# Patient Record
Sex: Male | Born: 1980 | Race: White | Hispanic: No | Marital: Married | Smoking: Former smoker
Health system: Southern US, Community
[De-identification: ages and names within clinical notes are randomized; demographics above are authoritative.]

## PROBLEM LIST (undated history)

## (undated) DIAGNOSIS — E7800 Pure hypercholesterolemia, unspecified: Secondary | ICD-10-CM

## (undated) DIAGNOSIS — F319 Bipolar disorder, unspecified: Secondary | ICD-10-CM

## (undated) DIAGNOSIS — F909 Attention-deficit hyperactivity disorder, unspecified type: Secondary | ICD-10-CM

---

## 2010-04-22 ENCOUNTER — Ambulatory Visit (HOSPITAL_COMMUNITY): Payer: Self-pay | Admitting: Psychiatry

## 2010-05-06 ENCOUNTER — Ambulatory Visit (HOSPITAL_COMMUNITY): Payer: Self-pay | Admitting: Psychology

## 2010-06-01 ENCOUNTER — Ambulatory Visit (HOSPITAL_COMMUNITY): Payer: Self-pay | Admitting: Psychology

## 2010-06-17 ENCOUNTER — Ambulatory Visit (HOSPITAL_COMMUNITY): Payer: Self-pay | Admitting: Psychiatry

## 2010-06-22 ENCOUNTER — Ambulatory Visit (HOSPITAL_COMMUNITY): Payer: Self-pay | Admitting: Psychology

## 2010-08-12 ENCOUNTER — Ambulatory Visit (HOSPITAL_COMMUNITY): Admit: 2010-08-12 | Payer: Self-pay | Admitting: Psychiatry

## 2010-08-12 ENCOUNTER — Encounter (INDEPENDENT_AMBULATORY_CARE_PROVIDER_SITE_OTHER): Payer: Self-pay | Admitting: Psychiatry

## 2010-08-12 DIAGNOSIS — F3289 Other specified depressive episodes: Secondary | ICD-10-CM

## 2010-08-26 ENCOUNTER — Encounter (HOSPITAL_COMMUNITY): Payer: Self-pay | Admitting: Psychology

## 2010-08-26 ENCOUNTER — Encounter (INDEPENDENT_AMBULATORY_CARE_PROVIDER_SITE_OTHER): Payer: BC Managed Care – PPO | Admitting: Psychiatry

## 2010-08-26 DIAGNOSIS — F3289 Other specified depressive episodes: Secondary | ICD-10-CM

## 2010-09-02 ENCOUNTER — Encounter (HOSPITAL_COMMUNITY): Payer: Self-pay | Admitting: Psychology

## 2010-10-07 ENCOUNTER — Encounter (INDEPENDENT_AMBULATORY_CARE_PROVIDER_SITE_OTHER): Payer: BC Managed Care – PPO | Admitting: Psychiatry

## 2010-10-07 DIAGNOSIS — F3289 Other specified depressive episodes: Secondary | ICD-10-CM

## 2010-12-02 ENCOUNTER — Encounter (INDEPENDENT_AMBULATORY_CARE_PROVIDER_SITE_OTHER): Payer: BC Managed Care – PPO | Admitting: Psychiatry

## 2010-12-02 DIAGNOSIS — F3289 Other specified depressive episodes: Secondary | ICD-10-CM

## 2011-02-24 ENCOUNTER — Encounter (INDEPENDENT_AMBULATORY_CARE_PROVIDER_SITE_OTHER): Payer: BC Managed Care – PPO | Admitting: Psychiatry

## 2011-02-24 DIAGNOSIS — F909 Attention-deficit hyperactivity disorder, unspecified type: Secondary | ICD-10-CM

## 2011-02-24 DIAGNOSIS — F3289 Other specified depressive episodes: Secondary | ICD-10-CM

## 2011-06-12 ENCOUNTER — Other Ambulatory Visit (HOSPITAL_COMMUNITY): Payer: Self-pay | Admitting: Psychiatry

## 2011-06-16 ENCOUNTER — Encounter (HOSPITAL_COMMUNITY): Payer: BC Managed Care – PPO | Admitting: Psychiatry

## 2011-06-16 ENCOUNTER — Ambulatory Visit (INDEPENDENT_AMBULATORY_CARE_PROVIDER_SITE_OTHER): Payer: BC Managed Care – PPO | Admitting: Psychiatry

## 2011-06-16 ENCOUNTER — Encounter (HOSPITAL_COMMUNITY): Payer: Self-pay | Admitting: Psychiatry

## 2011-06-16 DIAGNOSIS — F909 Attention-deficit hyperactivity disorder, unspecified type: Secondary | ICD-10-CM

## 2011-06-16 MED ORDER — LISDEXAMFETAMINE DIMESYLATE 60 MG PO CAPS: 60.0000 mg | ORAL_CAPSULE | ORAL | Status: DC

## 2011-06-16 NOTE — Progress Notes (Signed)
Patient ID: James Cuevas, male   DOB: [birth year], 30 y.o.   MRN: [id number] Has had recent bout of pneumonia treated with antibiotics.  Short bout of depression that is resolving.  Still not sleeping well since illness.  Mood usually stable.  OK with meds.

## 2011-09-08 ENCOUNTER — Ambulatory Visit (HOSPITAL_COMMUNITY): Payer: BC Managed Care – PPO | Admitting: Psychiatry

## 2017-11-26 ENCOUNTER — Encounter (HOSPITAL_COMMUNITY): Payer: Self-pay | Admitting: *Deleted

## 2017-11-26 ENCOUNTER — Other Ambulatory Visit: Payer: Self-pay

## 2017-11-26 ENCOUNTER — Emergency Department (HOSPITAL_COMMUNITY): Payer: 59

## 2017-11-26 ENCOUNTER — Emergency Department (HOSPITAL_COMMUNITY)
Admission: EM | Admit: 2017-11-26 | Discharge: 2017-11-27 | Disposition: A | Discharge status: Home / Self Care | Payer: 59 | Attending: Emergency Medicine | Admitting: Emergency Medicine

## 2017-11-26 DIAGNOSIS — R079 Chest pain, unspecified: Secondary | ICD-10-CM | POA: Diagnosis present

## 2017-11-26 DIAGNOSIS — R0789 Other chest pain: Secondary | ICD-10-CM | POA: Diagnosis not present

## 2017-11-26 DIAGNOSIS — Z87891 Personal history of nicotine dependence: Secondary | ICD-10-CM | POA: Insufficient documentation

## 2017-11-26 HISTORY — DX: Bipolar disorder, unspecified: F31.9

## 2017-11-26 HISTORY — DX: Pure hypercholesterolemia, unspecified: E78.00

## 2017-11-26 HISTORY — DX: Attention-deficit hyperactivity disorder, unspecified type: F90.9

## 2017-11-26 LAB — BASIC METABOLIC PANEL
Anion gap: 8 (ref 5–15)
BUN: 24 mg/dL — AB (ref 6–20)
CO2: 25 mmol/L (ref 22–32)
Calcium: 9.1 mg/dL (ref 8.9–10.3)
Chloride: 104 mmol/L (ref 101–111)
Creatinine, Ser: 1.13 mg/dL (ref 0.61–1.24)
GFR calc Af Amer: >60 mL/min (ref >60)
GLUCOSE: 98 mg/dL (ref 65–99)
POTASSIUM: 3.6 mmol/L (ref 3.5–5.1)
Sodium: 137 mmol/L (ref 135–145)

## 2017-11-26 LAB — TROPONIN I: Troponin I: <0.03 ng/mL (ref <0.03)

## 2017-11-26 MED ORDER — ONDANSETRON 4 MG PO TBDP
4.0000 mg | ORAL_TABLET | Freq: Once | ORAL | Status: AC
  Administered 2017-11-26: 4 mg via ORAL

## 2017-11-26 MED ORDER — ONDANSETRON HCL 4 MG/2ML IJ SOLN
INTRAMUSCULAR | Status: AC
  Filled 2017-11-26: qty 2

## 2017-11-26 MED ORDER — ONDANSETRON 4 MG PO TBDP
ORAL_TABLET | ORAL | Status: AC
  Filled 2017-11-26: qty 1

## 2017-11-26 NOTE — ED Triage Notes (Signed)
Pt reports chest pain and sob x since earlier today. Pt denies any other symptoms.

## 2017-11-26 NOTE — ED Notes (Signed)
Pt's visitor came out stating that pt was having a seizure. Upon entering room, pt was noted to be pale in color but awake and able to answer all questions. Lab was at bedside drawing blood at the time this happened. Pt's HR on monitor was 33-44 during episode. Dr Wilkie Aye to room and determined that pt had a vagal response to having his blood drawn. Pt nauseated and given medication for this. HR WNL. Will continue to monitor.

## 2017-11-26 NOTE — ED Provider Notes (Signed)
[facility] EMERGENCY DEPARTMENT Provider Note   CSN: [id number] Arrival date & time: 11/26/17  2118     History   Chief Complaint Chief Complaint  Patient presents with  . Chest Pain    HPI James Cuevas is a 37 y.o. male.  HPI  This is a 37 year old male with a history of hypercholesterolemia who presents with chest pain.  Patient reports intermittent episodes of "tapping" like chest pain.  He states that this comes and goes randomly.  It lasts for seconds at a time and self resolves.  Has not noted any relation with exertion or food.  Denies any fevers, cough, shortness of breath.  Never had pain like this before.  Denies lower extremity swelling or history of blood clots.  He does report increased stress at work.  Of note, I was called to the room as patient had an episode of loss of consciousness.  He was bradycardic during this time.  On my evaluation, patient is nauseous.  He states that this all occurred when getting his blood drawn.  This is happened to him in the past where he has passed out getting his blood drawn.  Denies any abdominal pain.  Past Medical History:  Diagnosis Date  . ADHD   . Bipolar 1 disorder (HCC)   . Hypercholesteremia     There are no active problems to display for this patient.   History reviewed. No pertinent surgical history.      Home Medications    Prior to Admission medications   Medication Sig Start Date End Date Taking? Authorizing Provider  DULoxetine (CYMBALTA) 30 MG capsule Take 30-60 mg by mouth See admin instructions.  in the morning and  at bedtime   Yes [provider]  QUEtiapine (SEROQUEL) 200 MG tablet Take 200 mg by mouth daily.   Yes [provider]    Family History No family history on file.  Social History Social History   Tobacco Use  . Smoking status: Former Games developer  . Smokeless tobacco: Former Engineer, water Use Topics  . Alcohol use: Yes    Comment: occassionally   .  Drug use: Never     Allergies   Depakote [divalproex sodium] and Oxycodone   Review of Systems Review of Systems  Constitutional: Positive for fever.  Respiratory: Negative for cough and shortness of breath.   Cardiovascular: Positive for chest pain. Negative for leg swelling.  Gastrointestinal: Positive for nausea. Negative for abdominal pain and vomiting.  Genitourinary: Negative for dysuria.  Neurological: Positive for syncope.  All other systems reviewed and are negative.    Physical Exam Updated Vital Signs BP 106/76   Pulse 62   Temp 98.2 F (36.8 C) (Oral)   Resp 20   Ht  (1.803 m)   Wt 86.6 kg (191 lb)   SpO2 100%   BMI 26.64 kg/m   Physical Exam  Constitutional: He is oriented to person, place, and time. He appears well-developed and well-nourished. No distress.  Pale-appearing but nontoxic  HENT:  Head: Normocephalic and atraumatic.  Eyes: Pupils are equal, round, and reactive to light.  Neck: Neck supple.  Cardiovascular: Normal rate, regular rhythm, normal heart sounds and normal pulses.  No murmur heard. Pulmonary/Chest: Effort normal and breath sounds normal. No respiratory distress. He has no wheezes.  Abdominal: Soft. Bowel sounds are normal. There is no tenderness. There is no rebound.  Musculoskeletal: He exhibits no edema.       Right lower leg:  He exhibits no tenderness and no edema.       Left lower leg: He exhibits no tenderness and no edema.  Lymphadenopathy:    He has no cervical adenopathy.  Neurological: He is alert and oriented to person, place, and time. No cranial nerve deficit.  Skin: Skin is warm and dry.  Psychiatric: He has a normal mood and affect.  Nursing note and vitals reviewed.    ED Treatments / Results  Labs (all labs ordered are listed, but only abnormal results are displayed) Labs Reviewed  BASIC METABOLIC PANEL - Abnormal; Notable for the following components:      Result Value   BUN 24 (*)    All other  components within normal limits  CBC  TROPONIN I    EKG EKG Interpretation  Date/Time:  Monday Nov 26 2017 21:46:31 EDT Ventricular Rate:  86 PR Interval:  142 QRS Duration: 84 QT Interval:  352 QTC Calculation: 421 R Axis:   71 Text Interpretation:  Normal sinus rhythm Nonspecific T wave abnormality Abnormal ECG No prior for comparison Confirmed by Ross Marcus ([id number]) on 11/26/2017 10:59:39 PM   Radiology Dg Chest 2 View  Result Date: 11/26/2017 CLINICAL DATA:  Initial evaluation for acute chest pain, shortness of breath. EXAM: CHEST - 2 VIEW COMPARISON:  None. FINDINGS: The cardiac and mediastinal silhouettes are within normal limits. The lungs are normally inflated. No airspace consolidation, pleural effusion, or pulmonary edema is identified. There is no pneumothorax. No acute osseous abnormality identified. IMPRESSION: No active cardiopulmonary disease. Electronically Signed   By: Rise Mu M.D.   On: 11/26/2017 22:35    Procedures Procedures (including critical care time)  Medications Ordered in ED Medications  ondansetron (ZOFRAN-ODT) disintegrating tablet 4 mg (4 mg Oral Given 11/26/17 2313)     Initial Impression / Assessment and Plan / ED Course  I have reviewed the triage vital signs and the nursing notes.  Pertinent labs & imaging results that were available during my care of the patient were reviewed by me and considered in my medical decision making (see chart for details).     Presents with chest pain.  Fairly atypical and not exertional and nature.  He is overall nontoxic-appearing and vital signs are reassuring.  He did appear to have a vagal episode with blood draw.  He did not actually pass out but he got bradycardic and nauseous.  This is happened to him in the past.  He recovered spontaneously.  No evidence of arrhythmia or ischemia on EKG.  Troponin is negative.  Doubt ACS.  He is low risk for blood clots and PERC negative.  Chest x-ray  shows no evidence of pneumothorax or pneumonia.  He has had no recurrence of symptoms while in the ED.  Discussed with patient that he should follow-up with cardiology as an outpatient.  Feel he is safe for discharge home and outpatient follow-up.  After history, exam, and medical workup I feel the patient has been appropriately medically screened and is safe for discharge home. Pertinent diagnoses were discussed with the patient. Patient was given return precautions.   Final Clinical Impressions(s) / ED Diagnoses   Final diagnoses:  Atypical chest pain    ED Discharge Orders    None       Selestino Nila, Mayer Masker, MD 11/27/17 [phone]

## 2017-11-27 LAB — CBC
HEMATOCRIT: 41.6 % (ref 39.0–52.0)
Hemoglobin: 14.4 g/dL (ref 13.0–17.0)
MCH: 30.6 pg (ref 26.0–34.0)
MCHC: 34.6 g/dL (ref 30.0–36.0)
MCV: 88.5 fL (ref 78.0–100.0)
Platelets: 279 10*3/uL (ref 150–400)
RBC: 4.7 MIL/uL (ref 4.22–5.81)
RDW: 12.2 % (ref 11.5–15.5)
WBC: 9.5 10*3/uL (ref 4.0–10.5)

## 2017-11-27 NOTE — Discharge Instructions (Addendum)
You were seen today for chest pain.  Your work-up is reassuring.  Follow-up with cardiology if symptoms persist.

## 2018-10-16 ENCOUNTER — Encounter (HOSPITAL_COMMUNITY): Payer: Self-pay | Admitting: Emergency Medicine

## 2018-10-16 ENCOUNTER — Other Ambulatory Visit: Payer: Self-pay

## 2018-10-16 ENCOUNTER — Emergency Department (HOSPITAL_COMMUNITY): Payer: BLUE CROSS/BLUE SHIELD

## 2018-10-16 ENCOUNTER — Emergency Department (HOSPITAL_COMMUNITY)
Admission: EM | Admit: 2018-10-16 | Discharge: 2018-10-16 | Disposition: A | Discharge status: Home / Self Care | Payer: BLUE CROSS/BLUE SHIELD | Attending: Emergency Medicine | Admitting: Emergency Medicine

## 2018-10-16 DIAGNOSIS — M545 Low back pain, unspecified: Secondary | ICD-10-CM

## 2018-10-16 DIAGNOSIS — Z87891 Personal history of nicotine dependence: Secondary | ICD-10-CM | POA: Diagnosis not present

## 2018-10-16 DIAGNOSIS — R1084 Generalized abdominal pain: Secondary | ICD-10-CM | POA: Insufficient documentation

## 2018-10-16 DIAGNOSIS — Z79899 Other long term (current) drug therapy: Secondary | ICD-10-CM | POA: Diagnosis not present

## 2018-10-16 DIAGNOSIS — R109 Unspecified abdominal pain: Secondary | ICD-10-CM

## 2018-10-16 LAB — URINALYSIS, ROUTINE W REFLEX MICROSCOPIC
Bilirubin Urine: NEGATIVE
Glucose, UA: NEGATIVE mg/dL
Hgb urine dipstick: NEGATIVE
Ketones, ur: NEGATIVE mg/dL
Leukocytes,Ua: NEGATIVE
Nitrite: NEGATIVE
Protein, ur: NEGATIVE mg/dL
Specific Gravity, Urine: 1.032 — ABNORMAL HIGH (ref 1.005–1.030)
pH: 5 (ref 5.0–8.0)

## 2018-10-16 MED ORDER — KETOROLAC TROMETHAMINE 30 MG/ML IJ SOLN
30.0000 mg | Freq: Once | INTRAMUSCULAR | Status: AC
  Administered 2018-10-16: 18:00:00 30 mg via INTRAMUSCULAR
  Filled 2018-10-16: qty 1

## 2018-10-16 MED ORDER — METHOCARBAMOL 750 MG PO TABS: 750.0000 mg | ORAL_TABLET | Freq: Three times a day (TID) | ORAL | 0 refills | Status: DC | PRN

## 2018-10-16 NOTE — ED Triage Notes (Signed)
Patient has bilateral flank pain that started around 1 pm.

## 2018-10-16 NOTE — ED Notes (Signed)
Pt returned from ct. nad 

## 2018-10-16 NOTE — ED Notes (Signed)
Pt taken to ct. nAd

## 2018-10-16 NOTE — Discharge Instructions (Addendum)
It was our pleasure to provide your ER care today - we hope that you feel better.  Take motrin or aleve as need for pain (available over the counter). You may also take robaxin as need for muscle pain/spasm - no driving when taking.  Follow up with primary care doctor in the next few weeks if symptoms fail to improve/resolve.  Return to ER if worse, intractable pain, numbness/weakness, other concern.

## 2018-10-16 NOTE — ED Provider Notes (Signed)
[facility] EMERGENCY DEPARTMENT Provider Note   CSN: [id number] Arrival date & time: 10/16/18  1729    History   Chief Complaint Chief Complaint  Patient presents with   Flank Pain    HPI James Cuevas is a 38 y.o. male.     Patient c/o acute onset left flank pain posteriorly this afternoon. Pain dull, mod-severe, waxes and wanes in intensity, non radiating, persistent. Denies hx same pain. No hx kidney stones. No hx chronic/recurrent back pain. No hx ddd. Denies back injury or strain. No hematuria or dysuria. No numbness or weakness in saddle area or in legs. No urinary retention or bowel incontinence. No fever or chills.   The history is provided by the patient.  Flank Pain  Pertinent negatives include no chest pain, no abdominal pain and no shortness of breath.    Past Medical History:  Diagnosis Date   ADHD    Bipolar 1 disorder (HCC)    Hypercholesteremia     There are no active problems to display for this patient.   History reviewed. No pertinent surgical history.      Home Medications    Prior to Admission medications   Medication Sig Start Date End Date Taking? Authorizing Provider  DULoxetine (CYMBALTA) 30 MG capsule Take 30-60 mg by mouth See admin instructions.  in the morning and  at bedtime    [provider]  QUEtiapine (SEROQUEL) 200 MG tablet Take 200 mg by mouth daily.    [provider]    Family History History reviewed. No pertinent family history.  Social History Social History   Tobacco Use   Smoking status: Former Smoker   Smokeless tobacco: Former Engineer, water Use Topics   Alcohol use: Yes    Comment: occassionally    Drug use: Never     Allergies   Depakote [divalproex sodium] and Oxycodone   Review of Systems Review of Systems  Constitutional: Negative for fever.  HENT: Negative for sore throat.   Eyes: Negative for redness.  Respiratory: Negative for cough and shortness of  breath.   Cardiovascular: Negative for chest pain.  Gastrointestinal: Negative for abdominal pain and vomiting.  Genitourinary: Positive for flank pain. Negative for dysuria and hematuria.  Musculoskeletal: Positive for back pain.  Skin: Negative for rash.  Neurological: Negative for weakness and numbness.  Hematological: Does not bruise/bleed easily.  Psychiatric/Behavioral: Negative for confusion.     Physical Exam Updated Vital Signs BP 106/81 (BP Location: Left Arm)    Pulse (!) 107    Temp 98.5 F (36.9 C) (Oral)    Resp 20    Ht 1.803 m ( )    Wt 94.3 kg    SpO2 96%    BMI 29.01 kg/m   Physical Exam Vitals signs and nursing note reviewed.  Constitutional:      Appearance: Normal appearance. He is well-developed.  HENT:     Head: Atraumatic.     Nose: Nose normal.     Mouth/Throat:     Mouth: Mucous membranes are moist.     Pharynx: Oropharynx is clear.  Eyes:     General: No scleral icterus.    Conjunctiva/sclera: Conjunctivae normal.  Neck:     Musculoskeletal: Normal range of motion and neck supple. No neck rigidity.     Trachea: No tracheal deviation.  Cardiovascular:     Rate and Rhythm: Normal rate.     Pulses: Normal pulses.  Pulmonary:     Effort: Pulmonary  effort is normal. No accessory muscle usage or respiratory distress.  Abdominal:     General: Bowel sounds are normal. There is no distension.     Palpations: Abdomen is soft. There is no mass.     Tenderness: There is no abdominal tenderness. There is no guarding.  Genitourinary:    Comments: No cva tenderness. No scrotal or testicular pain or tenderness.  Musculoskeletal:        General: No swelling.     Comments: TLS spine non tender, aligned.   Skin:    General: Skin is warm and dry.     Findings: No rash.  Neurological:     Mental Status: He is alert.     Comments: Alert, speech clear. Left straight leg raise neg. Motor intact bil, stre 5/5. sens grossly intact. Steady gait.     Psychiatric:        Mood and Affect: Mood normal.      ED Treatments / Results  Labs (all labs ordered are listed, but only abnormal results are displayed) Results for orders placed or performed during the hospital encounter of 10/16/18  Urinalysis, Routine w reflex microscopic  Result Value Ref Range   Color, Urine YELLOW YELLOW   APPearance HAZY (A) CLEAR   Specific Gravity, Urine 1.032 (H) 1.005 - 1.030   pH 5.0 5.0 - 8.0   Glucose, UA NEGATIVE NEGATIVE mg/dL   Hgb urine dipstick NEGATIVE NEGATIVE   Bilirubin Urine NEGATIVE NEGATIVE   Ketones, ur NEGATIVE NEGATIVE mg/dL   Protein, ur NEGATIVE NEGATIVE mg/dL   Nitrite NEGATIVE NEGATIVE   Leukocytes,Ua NEGATIVE NEGATIVE   Ct Renal Stone Study  Result Date: 10/16/2018 CLINICAL DATA:  Bilateral flank pain EXAM: CT ABDOMEN AND PELVIS WITHOUT CONTRAST TECHNIQUE: Multidetector CT imaging of the abdomen and pelvis was performed following the standard protocol without oral or IV contrast. COMPARISON:  None. FINDINGS: Lower chest: Lung bases are clear. Hepatobiliary: No focal liver lesions are apparent on this noncontrast enhanced study. Gallbladder is contracted without appreciable wall thickening. There is no appreciable biliary duct dilatation. Pancreas: There is no pancreatic mass or inflammatory focus. Spleen: No splenic lesions are evident. Adrenals/Urinary Tract: Adrenals bilaterally appear unremarkable. There is a 9 mm cyst arising from the medial lower pole of the right kidney. There is no hydronephrosis on either side. There is no evident renal or ureteral calculus on either side. Urinary bladder is essentially empty. Urinary bladder wall does not appear appreciably thickened given essentially empty state. Stomach/Bowel: There is no appreciable bowel wall or mesenteric thickening. Most small bowel loops are fluid-filled. There is no evident bowel obstruction. There is no free air or portal venous air. Vascular/Lymphatic: There is aortic  and common iliac artery atherosclerosis. No evident aneurysm. No adenopathy is evident in the abdomen or pelvis. Reproductive: Prostate and seminal vesicles are normal in size and contour. There is no evident pelvic mass. Other: Appendix appears normal. There is no abscess or ascites in the abdomen or pelvis. Musculoskeletal: There are no blastic or lytic bone lesions. There is no intramuscular or abdominal wall lesion evident. IMPRESSION: 1. Most small bowel loops are fluid-filled. While this finding may be seen normally, it may be indicative of enteritis or early ileus. No evident bowel obstruction. 2.  No abscess in the abdomen or pelvis.  Appendix appears normal. 3. No evident renal or ureteral calculus. No hydronephrosis. Urinary bladder is centrally empty. 4.  Aortic Atherosclerosis (ICD10-I70.0). Electronically Signed   By: Bretta Bang III  M.D.   On: 10/16/2018 19:02    EKG None  Radiology Ct Renal Stone Study  Result Date: 10/16/2018 CLINICAL DATA:  Bilateral flank pain EXAM: CT ABDOMEN AND PELVIS WITHOUT CONTRAST TECHNIQUE: Multidetector CT imaging of the abdomen and pelvis was performed following the standard protocol without oral or IV contrast. COMPARISON:  None. FINDINGS: Lower chest: Lung bases are clear. Hepatobiliary: No focal liver lesions are apparent on this noncontrast enhanced study. Gallbladder is contracted without appreciable wall thickening. There is no appreciable biliary duct dilatation. Pancreas: There is no pancreatic mass or inflammatory focus. Spleen: No splenic lesions are evident. Adrenals/Urinary Tract: Adrenals bilaterally appear unremarkable. There is a 9 mm cyst arising from the medial lower pole of the right kidney. There is no hydronephrosis on either side. There is no evident renal or ureteral calculus on either side. Urinary bladder is essentially empty. Urinary bladder wall does not appear appreciably thickened given essentially empty state. Stomach/Bowel:  There is no appreciable bowel wall or mesenteric thickening. Most small bowel loops are fluid-filled. There is no evident bowel obstruction. There is no free air or portal venous air. Vascular/Lymphatic: There is aortic and common iliac artery atherosclerosis. No evident aneurysm. No adenopathy is evident in the abdomen or pelvis. Reproductive: Prostate and seminal vesicles are normal in size and contour. There is no evident pelvic mass. Other: Appendix appears normal. There is no abscess or ascites in the abdomen or pelvis. Musculoskeletal: There are no blastic or lytic bone lesions. There is no intramuscular or abdominal wall lesion evident. IMPRESSION: 1. Most small bowel loops are fluid-filled. While this finding may be seen normally, it may be indicative of enteritis or early ileus. No evident bowel obstruction. 2.  No abscess in the abdomen or pelvis.  Appendix appears normal. 3. No evident renal or ureteral calculus. No hydronephrosis. Urinary bladder is centrally empty. 4.  Aortic Atherosclerosis (ICD10-I70.0). Electronically Signed   By: Bretta BangWilliam  Woodruff III M.D.   On: 10/16/2018 19:02    Procedures Procedures (including critical care time)  Medications Ordered in ED Medications  ketorolac (TORADOL) 30 MG/ML injection 30 mg (has no administration in time range)     Initial Impression / Assessment and Plan / ED Course  I have reviewed the triage vital signs and the nursing notes.  Pertinent labs & imaging results that were available during my care of the patient were reviewed by me and considered in my medical decision making (see chart for details).  Labs and imaging ordered.   Reviewed nursing notes and prior charts for additional history.   Toradol 30 mg im.   CT reviewed by me - no ureteral stones, no hydro.  Labs reviewed by me - no hematuria.   Based on ED workup, feel MSK pain most likely, msk strain vs ddd.   Patient appears comfortable, nad and stable for d/c.   rec  pcp f/u.   Return precautions provided.     Final Clinical Impressions(s) / ED Diagnoses   Final diagnoses:  None    ED Discharge Orders    None       Cathren LaineSteinl, Armstrong Creasy, MD 10/16/18 1907

## 2018-10-16 NOTE — ED Notes (Signed)
Patient transported to CT 

## 2019-03-06 ENCOUNTER — Ambulatory Visit
Admission: EM | Admit: 2019-03-06 | Discharge: 2019-03-06 | Disposition: A | Discharge status: Home / Self Care | Payer: Managed Care, Other (non HMO) | Attending: Emergency Medicine | Admitting: Emergency Medicine

## 2019-03-06 ENCOUNTER — Other Ambulatory Visit: Payer: Self-pay

## 2019-03-06 DIAGNOSIS — G43009 Migraine without aura, not intractable, without status migrainosus: Secondary | ICD-10-CM

## 2019-03-06 DIAGNOSIS — R110 Nausea: Secondary | ICD-10-CM

## 2019-03-06 MED ORDER — ONDANSETRON 4 MG PO TBDP
4.0000 mg | ORAL_TABLET | Freq: Once | ORAL | Status: AC
  Administered 2019-03-06: 4 mg via ORAL

## 2019-03-06 MED ORDER — IBUPROFEN 800 MG PO TABS: 800.0000 mg | ORAL_TABLET | Freq: Three times a day (TID) | ORAL | 0 refills | Status: DC

## 2019-03-06 MED ORDER — IBUPROFEN 800 MG PO TABS
800.0000 mg | ORAL_TABLET | Freq: Once | ORAL | Status: AC
  Administered 2019-03-06: 800 mg via ORAL

## 2019-03-06 MED ORDER — ONDANSETRON HCL 4 MG PO TABS: 4.0000 mg | ORAL_TABLET | Freq: Four times a day (QID) | ORAL | 0 refills | Status: AC

## 2019-03-06 NOTE — ED Triage Notes (Signed)
Pt states she has had anal itching for past month, she has used preperation 8 and caused redness and burning

## 2019-03-06 NOTE — ED Provider Notes (Signed)
[facility]   [id number] 03/06/19 Arrival Time: 1346  WU:[id number]:HEADACHE  SUBJECTIVE:  James Cuevas is a 38 y.o. male hx significant for ADHD, bipolar 1 disorder, and hypercholesteremia, who complains of migraine x 2 days.  Denies a precipitating event, or recent head trauma.  Patient localizes his pain to the frontal aspect.  Describes the pain as intermittent and pounding in character.  Patient has NOT tried OTC medications. Symptoms are made worse with light.  Reports similar migraine in the past.  This is not the worst headache of his life. Complains of associated nausea and photophobia. Patient denies fever, chills, vision changes, vomiting, chest pain, SOB, abdominal pain, weakness, numbness or tingling, slurred speech.     ROS: As per HPI.  All other pertinent ROS negative.     Past Medical History:  Diagnosis Date  . ADHD   . Bipolar 1 disorder (HCC)   . Hypercholesteremia    History reviewed. No pertinent surgical history. Allergies  Allergen Reactions  . Depakote [Divalproex Sodium] Nausea Only    headache  . Oxycodone     Hallucinations-Feels like bugs are crawling   No current facility-administered medications on file prior to encounter.    Current Outpatient Medications on File Prior to Encounter  Medication Sig Dispense Refill  . Acetaminophen (PAIN RELIEVER PO) Take 1-2 tablets by mouth once as needed (for muscle relaxation/pain).    . QUEtiapine (SEROQUEL) 300 MG tablet Take 600 mg by mouth at bedtime.    Marland Kitchen. venlafaxine (EFFEXOR) 75 MG tablet Take 75 mg by mouth 3 (three) times daily.     Social History   Socioeconomic History  . Marital status: Married    Spouse name: Not on file  . Number of children: Not on file  . Years of education: Not on file  . Highest education level: Not on file  Occupational History  . Not on file  Social Needs  . Financial resource strain: Not on file  . Food insecurity    Worry: Not on file    Inability: Not on  file  . Transportation needs    Medical: Not on file    Non-medical: Not on file  Tobacco Use  . Smoking status: Former Games developermoker  . Smokeless tobacco: Former Engineer, waterUser  Substance and Sexual Activity  . Alcohol use: Yes    Comment: occassionally   . Drug use: Never  . Sexual activity: Not on file  Lifestyle  . Physical activity    Days per week: Not on file    Minutes per session: Not on file  . Stress: Not on file  Relationships  . Social Musicianconnections    Talks on phone: Not on file    Gets together: Not on file    Attends religious service: Not on file    Active member of club or organization: Not on file    Attends meetings of clubs or organizations: Not on file    Relationship status: Not on file  . Intimate partner violence    Fear of current or ex partner: Not on file    Emotionally abused: Not on file    Physically abused: Not on file    Forced sexual activity: Not on file  Other Topics Concern  . Not on file  Social History Narrative  . Not on file   History reviewed. No pertinent family history.  OBJECTIVE:  Vitals:   03/06/19 1356  BP: (!) 149/84  Pulse: (!) 112  Resp: 20  Temp: 98.3 F (36.8 C)  SpO2: 94%    General appearance: alert; appears uncomfortable, wearing sunglasses upon entering the room Eyes: PERRLA; EOMI grossly HENT: normocephalic; atraumatic; oropharynx clear Neck: supple with FROM Lungs: clear to auscultation bilaterally; normal respiratory effort Heart: regular rate and rhythm.  Radial pulses 2+ symmetrical bilaterally Extremities: no edema; symmetrical with no gross deformities Skin: warm and dry Neurologic: CN 2-12 grossly intact; finger to nose without difficulty; normal gait; strength and sensation intact bilaterally about the upper and lower extremities; negative pronator drift Psychological: alert and cooperative; normal mood and affect   ASSESSMENT & PLAN:  1. Migraine without aura and without status migrainosus, not intractable    2. Nausea without vomiting     Meds ordered this encounter  Medications  . ibuprofen (ADVIL) 800 MG tablet    Sig: Take 1 tablet (800 mg total) by mouth 3 (three) times daily.    Dispense:  21 tablet    Refill:  0    Order Specific Question:   Supervising Provider    Answer:   Raylene Everts [[id number]]  . ondansetron (ZOFRAN) 4 MG tablet    Sig: Take 1 tablet (4 mg total) by mouth every 6 (six) hours.    Dispense:  12 tablet    Refill:  0    Order Specific Question:   Supervising Provider    Answer:   Raylene Everts [[id number]]  . ibuprofen (ADVIL) tablet 800 mg  . ondansetron (ZOFRAN-ODT) disintegrating tablet 4 mg    Ibuprofen 800 mg and zofran given in office Rest and drink plenty of fluids Ibuprofen 800 mg prescribed.  Take as directed for migraine.  Avoid taking with other antiinflammatories as this may cause GI upset and/or possibly bleed Zofran prescribed.  Use as needed for nausea.   Follow up with [facility] to establish care Return or go to the ER if you have any new or worsening symptoms such as fever, chills, nausea, vomiting, chest pain, shortness of breath, cough, vision changes, worsening headache despite treatment, slurred speech, facial asymmetry, weakness in arms or legs, etc...  Reviewed expectations re: course of current medical issues. Questions answered. Outlined signs and symptoms indicating need for more acute intervention. Patient verbalized understanding. After Visit Summary given.   Lestine Box, PA-C 03/06/19 1421

## 2019-03-06 NOTE — Discharge Instructions (Addendum)
Ibuprofen 800 mg and zofran given in office Rest and drink plenty of fluids Ibuprofen 800 mg prescribed.  Take as directed for migraine.  Avoid taking with other antiinflammatories as this may cause GI upset and/or possibly bleed Zofran prescribed.  Use as needed for nausea.   Follow up with [facility] to establish care Return or go to the ER if you have any new or worsening symptoms such as fever, chills, nausea, vomiting, chest pain, shortness of breath, cough, vision changes, worsening headache despite treatment, slurred speech, facial asymmetry, weakness in arms or legs, etc..Marland Kitchen

## 2019-03-06 NOTE — ED Triage Notes (Signed)
Headache x 2 days with dizziness

## 2019-08-18 ENCOUNTER — Ambulatory Visit
Admission: EM | Admit: 2019-08-18 | Discharge: 2019-08-18 | Disposition: A | Discharge status: Home / Self Care | Payer: Managed Care, Other (non HMO) | Attending: Emergency Medicine | Admitting: Emergency Medicine

## 2019-08-18 ENCOUNTER — Other Ambulatory Visit: Payer: Self-pay

## 2019-08-18 DIAGNOSIS — Z20822 Contact with and (suspected) exposure to covid-19: Secondary | ICD-10-CM

## 2019-08-18 LAB — POC SARS CORONAVIRUS 2 AG -  ED: SARS Coronavirus 2 Ag: NEGATIVE

## 2019-08-18 NOTE — ED Provider Notes (Signed)
[facility]    CSN: [id number] Arrival date & time: 08/18/19  6301      History   Chief Complaint No chief complaint on file.   HPI James Cuevas is a 39 y.o. male.   who presents for COVID testing after Covid exposure for the past 7 days.  Denies sick exposure to  flu or strep.  Denies recent travel.  Denies aggravating or alleviating symptoms.  Denies previous COVID infection.   Denies fever, chills, fatigue, nasal congestion, rhinorrhea, sore throat, cough, SOB, wheezing, chest pain, nausea, vomiting, changes in bowel or bladder habits.    The history is provided by the patient. No language interpreter was used.    Past Medical History:  Diagnosis Date  . ADHD   . Bipolar 1 disorder ([place])   . Hypercholesteremia     There are no problems to display for this patient.   History reviewed. No pertinent surgical history.     Home Medications    Prior to Admission medications   Medication Sig Start Date End Date Taking? Authorizing Provider  Acetaminophen (PAIN RELIEVER PO) Take 1-2 tablets by mouth once as needed (for muscle relaxation/pain).    [provider]  ibuprofen (ADVIL) 800 MG tablet Take 1 tablet (800 mg total) by mouth 3 (three) times daily. 03/06/19   Wurst, [place], PA-C  ondansetron (ZOFRAN) 4 MG tablet Take 1 tablet (4 mg total) by mouth every 6 (six) hours. 03/06/19   Wurst, [place], PA-C  QUEtiapine (SEROQUEL) 300 MG tablet Take 600 mg by mouth at bedtime.    [provider]  venlafaxine (EFFEXOR) 75 MG tablet Take 75 mg by mouth 3 (three) times daily.    [provider]    Family History Family History  Problem Relation Age of Onset  . Cancer Mother   . CAD Father     Social History Social History   Tobacco Use  . Smoking status: Former Research scientist (life sciences)  . Smokeless tobacco: Former Network engineer Use Topics  . Alcohol use: Yes    Comment: occassionally   . Drug use: Never     Allergies   Depakote  [divalproex sodium] and Oxycodone   Review of Systems Review of Systems  Constitutional: Negative.   HENT: Negative.   Respiratory: Negative.   Cardiovascular: Negative.   Gastrointestinal: Negative.   Neurological: Negative.      Physical Exam Triage Vital Signs ED Triage Vitals  Enc Vitals Group     BP      Pulse      Resp      Temp      Temp src      SpO2      Weight      Height      Head Circumference      Peak Flow      Pain Score      Pain Loc      Pain Edu?      Excl. in [place]?    No data found.  Updated Vital Signs BP 113/78   Pulse (!) 102   Temp 98.5 F (36.9 C)   Resp 18   SpO2 93%   Visual Acuity Right Eye Distance:   Left Eye Distance:   Bilateral Distance:    Right Eye Near:   Left Eye Near:    Bilateral Near:     Physical Exam Vitals and nursing note reviewed.  Constitutional:      General: He is not  in acute distress.    Appearance: Normal appearance. He is normal weight. He is not ill-appearing or toxic-appearing.  HENT:     Head: Normocephalic and atraumatic.     Right Ear: Tympanic membrane, ear canal and external ear normal. There is no impacted cerumen.     Left Ear: Tympanic membrane, ear canal and external ear normal. There is no impacted cerumen.     Nose: Nose normal. No congestion.     Mouth/Throat:     Mouth: Mucous membranes are moist.     Pharynx: Oropharynx is clear. No oropharyngeal exudate or posterior oropharyngeal erythema.  Cardiovascular:     Rate and Rhythm: Normal rate and regular rhythm.     Pulses: Normal pulses.     Heart sounds: Normal heart sounds. No murmur.  Pulmonary:     Effort: Pulmonary effort is normal. No respiratory distress.     Breath sounds: Normal breath sounds. No wheezing or rhonchi.  Chest:     Chest wall: No tenderness.  Abdominal:     General: Abdomen is flat. Bowel sounds are normal. There is no distension.     Palpations: There is no mass.     Tenderness: There is no abdominal  tenderness.  Skin:    Capillary Refill: Capillary refill takes less than 2 seconds.  Neurological:     General: No focal deficit present.     Mental Status: He is alert and oriented to person, place, and time.      UC Treatments / Results  Labs (all labs ordered are listed, but only abnormal results are displayed) Labs Reviewed  NOVEL CORONAVIRUS, NAA  POC SARS CORONAVIRUS 2 AG -  ED    EKG   Radiology No results found.  Procedures Procedures (including critical care time)  Medications Ordered in UC Medications - No data to display  Initial Impression / Assessment and Plan / UC Course  I have reviewed the triage vital signs and the nursing notes.  Pertinent labs & imaging results that were available during my care of the patient were reviewed by me and considered in my medical decision making (see chart for details).     POCT COVID-19 test was ordered and result is negative. [facility] COVID-19 test was ordered Work note was given Advised patient to quarantine To go to ED for worsening of symptoms Patient verbalized understanding of the plan of care  Final Clinical Impressions(s) / UC Diagnoses   Final diagnoses:  Exposure to COVID-19 virus     Discharge Instructions     POCT COVID-19 test was negative COVID testing ordered.  It will take between 2-7 days for test results.  Someone will contact you regarding abnormal results.    In the meantime: You should remain isolated in your home for 10 days from symptom onset AND greater than 72 hours after symptoms resolution (absence of fever without the use of fever-reducing medication and improvement in respiratory symptoms), whichever is longer Get plenty of rest and push fluids Use medications daily for symptom relief Use OTC medications like ibuprofen or tylenol as needed fever or pain Call or go to the ED if you have any new or worsening symptoms such as fever, worsening cough, shortness of breath, chest  tightness, chest pain, turning blue, changes in mental status, etc...     ED Prescriptions    None     PDMP not reviewed this encounter.   Durward Parcel, FNP 08/18/19 1011

## 2019-08-18 NOTE — ED Triage Notes (Signed)
Pt here for covid test after positive exposure at work

## 2019-08-18 NOTE — Discharge Instructions (Addendum)
POCT COVID-19 test was negative COVID testing ordered.  It will take between 2-7 days for test results.  Someone will contact you regarding abnormal results.    In the meantime: You should remain isolated in your home for 10 days from symptom onset AND greater than 72 hours after symptoms resolution (absence of fever without the use of fever-reducing medication and improvement in respiratory symptoms), whichever is longer Get plenty of rest and push fluids Use medications daily for symptom relief Use OTC medications like ibuprofen or tylenol as needed fever or pain Call or go to the ED if you have any new or worsening symptoms such as fever, worsening cough, shortness of breath, chest tightness, chest pain, turning blue, changes in mental status, etc...  

## 2019-08-19 LAB — NOVEL CORONAVIRUS, NAA: SARS-CoV-2, NAA: NOT DETECTED

## 2019-12-24 ENCOUNTER — Encounter: Payer: Self-pay | Admitting: Gastroenterology

## 2020-02-13 ENCOUNTER — Ambulatory Visit: Payer: Managed Care, Other (non HMO) | Admitting: Gastroenterology

## 2020-06-25 ENCOUNTER — Ambulatory Visit
Admission: EM | Admit: 2020-06-25 | Discharge: 2020-06-25 | Disposition: A | Discharge status: Home / Self Care | Payer: 59 | Attending: Emergency Medicine | Admitting: Emergency Medicine

## 2020-06-25 DIAGNOSIS — R197 Diarrhea, unspecified: Secondary | ICD-10-CM

## 2020-06-25 DIAGNOSIS — R1012 Left upper quadrant pain: Secondary | ICD-10-CM | POA: Diagnosis not present

## 2020-06-25 DIAGNOSIS — K219 Gastro-esophageal reflux disease without esophagitis: Secondary | ICD-10-CM | POA: Diagnosis not present

## 2020-06-25 MED ORDER — PANTOPRAZOLE SODIUM 40 MG PO TBEC: 40.0000 mg | DELAYED_RELEASE_TABLET | Freq: Every day | ORAL | 0 refills | Status: AC

## 2020-06-25 NOTE — ED Triage Notes (Signed)
Pt presents with c/o left side abdominal pain and diarrhea for past 2 days , appetite has been poor

## 2020-06-25 NOTE — ED Provider Notes (Signed)
[facility]   [id number] 06/25/20 Arrival Time: 0929  CC: ABDOMINAL DISCOMFORT  SUBJECTIVE:  James Cuevas is a 39 y.o. male who presents to the urgent care with a complaint acid reflux, left abdominal pain and diarrhea for the past 2 days.  Reports 1  loose stool so far today.  Denies a precipitating event, trauma, close contacts with similar symptoms, recent travel or antibiotic use.  Localizes pain to left upper quadrant.  Describes as intermittent and achy in character.  Has no tried any OTC medications.  Denies alleviating or aggravating factors.  Denies similar symptoms in the past.   Order lidocaine denies fever, chills, appetite changes, weight changes, nausea, vomiting, chest pain, SOB, constipation, hematochezia, melena, dysuria, difficulty urinating, increased frequency or urgency, flank pain, loss of bowel or bladder function,  pelvic pain.     No LMP for male patient.  ROS: As per HPI.  All other pertinent ROS negative.     Past Medical History:  Diagnosis Date  . ADHD   . Bipolar 1 disorder ([facility])   . Hypercholesteremia    History reviewed. No pertinent surgical history. Allergies  Allergen Reactions  . Depakote [Divalproex Sodium] Nausea Only    headache  . Oxycodone     Hallucinations-Feels like bugs are crawling   No current facility-administered medications on file prior to encounter.   Current Outpatient Medications on File Prior to Encounter  Medication Sig Dispense Refill  . Acetaminophen (PAIN RELIEVER PO) Take 1-2 tablets by mouth once as needed (for muscle relaxation/pain).    Marland Kitchen ibuprofen (ADVIL) 800 MG tablet Take 1 tablet (800 mg total) by mouth 3 (three) times daily. 21 tablet 0  . ondansetron (ZOFRAN) 4 MG tablet Take 1 tablet (4 mg total) by mouth every 6 (six) hours. 12 tablet 0  . QUEtiapine (SEROQUEL) 300 MG tablet Take 600 mg by mouth at bedtime.    Marland Kitchen venlafaxine (EFFEXOR) 75 MG tablet Take 75 mg by mouth 3 (three) times daily.      Social History   Socioeconomic History  . Marital status: Married    Spouse name: Not on file  . Number of children: Not on file  . Years of education: Not on file  . Highest education level: Not on file  Occupational History  . Not on file  Tobacco Use  . Smoking status: Former Games developer  . Smokeless tobacco: Former Clinical biochemist  . Vaping Use: Every day  Substance and Sexual Activity  . Alcohol use: Yes    Comment: occassionally   . Drug use: Never  . Sexual activity: Not on file  Other Topics Concern  . Not on file  Social History Narrative  . Not on file   Social Determinants of Health   Financial Resource Strain: Not on file  Food Insecurity: Not on file  Transportation Needs: Not on file  Physical Activity: Not on file  Stress: Not on file  Social Connections: Not on file  Intimate Partner Violence: Not on file   Family History  Problem Relation Age of Onset  . Cancer Mother   . CAD Father      OBJECTIVE:  Vitals:   06/25/20 0941  BP: (!) 138/91  Pulse: 97  Resp: 18  Temp: 98.2 F (36.8 C)  SpO2: 95%    General appearance: Alert; NAD HEENT: NCAT.  Oropharynx clear.  Lungs: clear to auscultation bilaterally without adventitious breath sounds Heart: regular rate and rhythm.  Radial pulses 2+  symmetrical bilaterally Abdomen: soft, non-distended; normal active bowel sounds; non-tender to light and deep palpation; nontender at McBurney's point; negative Murphy's sign; negative rebound; no guarding Back: no CVA tenderness Extremities: no edema; symmetrical with no gross deformities Skin: warm and dry Neurologic: normal gait Psychological: alert and cooperative; normal mood and affect  LABS: No results found for this or any previous visit (from the past 24 hour(s)).  DIAGNOSTIC STUDIES: No results found.   ASSESSMENT & PLAN:  1. LUQ abdominal pain   2. Gastroesophageal reflux disease, unspecified whether esophagitis present   3. Diarrhea,  unspecified type     Meds ordered this encounter  Medications  . pantoprazole (PROTONIX) 40 MG tablet    Sig: Take 1 tablet (40 mg total) by mouth daily.    Dispense:  30 tablet    Refill:  0   Discharge instructions   Get rest and drink fluids. Protonix was prescribed for acid reflux Increase your fluid intake to replace losses. Clear liquids only for 24 hours (water, tea, sport drinks, clear flat ginger ale or cola and juices, broth, jello, popsicles, ect). Advance to bland foods, applesauce, rice, baked or boiled chicken, ect. Avoid milk, greasy foods and anything that doesn't agree with you.  If you experience new or worsening symptoms return or go to ER such as fever, chills, nausea, vomiting, diarrhea, bloody or dark tarry stools, constipation, urinary symptoms, worsening abdominal discomfort, symptoms that do not improve with medications, inability to keep fluids down, etc...  Reviewed expectations re: course of current medical issues. Questions answered. Outlined signs and symptoms indicating need for more acute intervention. Patient verbalized understanding. After Visit Summary given.   Durward Parcel, FNP 06/25/20 1005

## 2020-06-25 NOTE — Discharge Instructions (Addendum)
Get rest and drink fluids. Protonix was prescribed for acid reflux Increase your fluid intake to replace losses. Clear liquids only for 24 hours (water, tea, sport drinks, clear flat ginger ale or cola and juices, broth, jello, popsicles, ect). Advance to bland foods, applesauce, rice, baked or boiled chicken, ect. Avoid milk, greasy foods and anything that doesn't agree with you.  If you experience new or worsening symptoms return or go to ER such as fever, chills, nausea, vomiting, diarrhea, bloody or dark tarry stools, constipation, urinary symptoms, worsening abdominal discomfort, symptoms that do not improve with medications, inability to keep fluids down, etc..Marland Kitchen

## 2020-08-17 ENCOUNTER — Emergency Department (HOSPITAL_COMMUNITY)
Admission: EM | Admit: 2020-08-17 | Discharge: 2020-08-17 | Disposition: A | Discharge status: Home / Self Care | Payer: 59 | Attending: Emergency Medicine | Admitting: Emergency Medicine

## 2020-08-17 ENCOUNTER — Other Ambulatory Visit: Payer: Self-pay

## 2020-08-17 ENCOUNTER — Emergency Department (HOSPITAL_COMMUNITY): Payer: 59

## 2020-08-17 DIAGNOSIS — W009XXA Unspecified fall due to ice and snow, initial encounter: Secondary | ICD-10-CM | POA: Diagnosis not present

## 2020-08-17 DIAGNOSIS — S4991XA Unspecified injury of right shoulder and upper arm, initial encounter: Secondary | ICD-10-CM | POA: Diagnosis present

## 2020-08-17 DIAGNOSIS — Z87891 Personal history of nicotine dependence: Secondary | ICD-10-CM | POA: Insufficient documentation

## 2020-08-17 DIAGNOSIS — S40011A Contusion of right shoulder, initial encounter: Secondary | ICD-10-CM | POA: Diagnosis not present

## 2020-08-17 NOTE — ED Triage Notes (Signed)
Fell yesterday, pain in right shoulder

## 2020-08-17 NOTE — ED Provider Notes (Signed)
[facility] EMERGENCY DEPARTMENT Provider Note   CSN: [id number] Arrival date & time: 08/17/20  1748     History Chief Complaint  Patient presents with  . Fall  . Shoulder Injury    James Cuevas is a 40 y.o. male.  HPI Presents for evaluation of injury to the right shoulder trauma yesterday on the way to work. He states he slipped on ice going down steps. He was able to work yesterday today. He is a Museum/gallery exhibitions officer. He states he has pain medicine at home which she can take. He denies injury to head, neck or back. There are no other known modifying factors.    Past Medical History:  Diagnosis Date  . ADHD   . Bipolar 1 disorder ([facility])   . Hypercholesteremia     There are no problems to display for this patient.   No past surgical history on file.     Family History  Problem Relation Age of Onset  . Cancer Mother   . CAD Father     Social History   Tobacco Use  . Smoking status: Former Games developer  . Smokeless tobacco: Former Clinical biochemist  . Vaping Use: Every day  Substance Use Topics  . Alcohol use: Yes    Comment: occassionally   . Drug use: Never    Home Medications Prior to Admission medications   Medication Sig Start Date End Date Taking? Authorizing Provider  Acetaminophen (PAIN RELIEVER PO) Take 1-2 tablets by mouth once as needed (for muscle relaxation/pain).    [provider]  ibuprofen (ADVIL) 800 MG tablet Take 1 tablet (800 mg total) by mouth 3 (three) times daily. 03/06/19   Wurst, [place], PA-C  ondansetron (ZOFRAN) 4 MG tablet Take 1 tablet (4 mg total) by mouth every 6 (six) hours. 03/06/19   Wurst, [place], PA-C  pantoprazole (PROTONIX) 40 MG tablet Take 1 tablet (40 mg total) by mouth daily. 06/25/20   Avegno, Zachery Dakins, FNP  QUEtiapine (SEROQUEL) 300 MG tablet Take 600 mg by mouth at bedtime.    [provider]  venlafaxine (EFFEXOR) 75 MG tablet Take 75 mg by mouth 3 (three) times daily.    [provider]     Allergies    Depakote [divalproex sodium] and Oxycodone  Review of Systems   Review of Systems  All other systems reviewed and are negative.   Physical Exam Updated Vital Signs BP (!) 132/99   Pulse 94   Temp 98.5 F (36.9 C)   Resp 18   Ht 5\' 11"  (1.803 m)   Wt 99.8 kg   SpO2 99%   BMI 30.68 kg/m   Physical Exam Vitals and nursing note reviewed.  Constitutional:      General: He is not in acute distress.    Appearance: He is well-developed and well-nourished. He is not ill-appearing, toxic-appearing or diaphoretic.  HENT:     Head: Normocephalic and atraumatic.     Right Ear: External ear normal.     Left Ear: External ear normal.  Eyes:     Extraocular Movements: EOM normal.     Conjunctiva/sclera: Conjunctivae normal.     Pupils: Pupils are equal, round, and reactive to light.  Neck:     Trachea: Phonation normal.  Cardiovascular:     Rate and Rhythm: Normal rate.  Pulmonary:     Effort: Pulmonary effort is normal.  Chest:     Chest wall: No bony tenderness.  Abdominal:  General: There is no distension.  Musculoskeletal:     Cervical back: Normal range of motion and neck supple.     Comments: Somewhat limited motion of the right shoulder secondary to pain, primarily with flexion. Diffusely tender right shoulder, without evident visible injury or deformity.  Skin:    General: Skin is warm, dry and intact.  Neurological:     Mental Status: He is alert and oriented to person, place, and time.     Cranial Nerves: No cranial nerve deficit.     Sensory: No sensory deficit.     Motor: No abnormal muscle tone.     Coordination: Coordination normal.  Psychiatric:        Mood and Affect: Mood and affect and mood normal.        Behavior: Behavior normal.        Thought Content: Thought content normal.        Judgment: Judgment normal.     ED Results / Procedures / Treatments   Labs (all labs ordered are listed, but only abnormal results are  displayed) Labs Reviewed - No data to display  EKG None  Radiology DG Shoulder Right  Result Date: 08/17/2020 CLINICAL DATA:  Pain status post fall EXAM: RIGHT SHOULDER - 2+ VIEW COMPARISON:  None. FINDINGS: There is no evidence of fracture or dislocation. There is no evidence of arthropathy or other focal bone abnormality. Soft tissues are unremarkable. IMPRESSION: Negative. Electronically Signed   By: Katherine Mantle M.D.   On: 08/17/2020 18:48    Procedures Procedures   Medications Ordered in ED Medications - No data to display  ED Course  I have reviewed the triage vital signs and the nursing notes.  Pertinent labs & imaging results that were available during my care of the patient were reviewed by me and considered in my medical decision making (see chart for details).    MDM Rules/Calculators/A&P                           Patient Vitals for the past 24 hrs:  BP Temp Pulse Resp SpO2 Height Weight  08/17/20 2100 (!) 132/99 -- 94 18 99 % -- --  08/17/20 1809 136/86 98.5 F (36.9 C) 92 20 -- -- --  08/17/20 1804 -- -- -- -- -- -- 99.8 kg  08/17/20 1801 -- -- -- -- -- 5\' 11"  (1.803 m) --    9:08 PM Reevaluation with update and discussion. After initial assessment and treatment, an updated evaluation reveals no change in status, findings discussed with the patient all questions were answered.   Medical Decision Making:  This patient is presenting for evaluation of isolated injury to right shoulder in a fall, 1 slipped, yesterday, which does require a range of treatment options, and is a complaint that involves a moderate risk of morbidity and mortality. The differential diagnoses include fracture, contusion, dislocation. I decided to review old records, and in summary right shoulder injury yesterday a fall, able to work for 2 days since.  I do not require additional historical information from anyone.   Radiologic Tests Ordered, included right shoulder  x-ray.  I independently Visualized: Right shoulder images, which show no fracture or dislocation. No DJD    Critical Interventions-clinical evaluation, x-ray, observation, reassessment  After These Interventions, the Patient was reevaluated and was found stable for discharge. Patient with contusions but no fracture or dislocation. Given note for release from work to  improve recovery.  CRITICAL CARE-no Performed by: Mancel Bale  Nursing Notes Reviewed/ Care Coordinated Applicable Imaging Reviewed Interpretation of Laboratory Data incorporated into ED treatment  The patient appears reasonably screened and/or stabilized for discharge and I doubt any other medical condition or other [facility] requiring further screening, evaluation, or treatment in the ED at this time prior to discharge.  Plan: Home Medications-continue usual medications; Home Treatments-rest, cryotherapy and heat therapy; return here if the recommended treatment, does not improve the symptoms; Recommended follow up-orthopedic follow-up as needed.     Final Clinical Impression(s) / ED Diagnoses Final diagnoses:  Contusion of multiple sites of right shoulder, initial encounter    Rx / DC Orders ED Discharge Orders    None       Mancel Bale, MD 08/17/20 2110

## 2020-08-17 NOTE — ED Notes (Signed)
Ice pack applied.

## 2020-08-17 NOTE — Discharge Instructions (Signed)
The x-ray did not show anything broken. Use ice on the sore spots 3-4 times a day for 2 days after that use heat. Gradually increase what you do with your shoulder. We are giving you a note stating out of work until next Monday. Call the orthopedic doctor if you are unable to work at that time.

## 2020-08-30 ENCOUNTER — Emergency Department (HOSPITAL_COMMUNITY): Payer: 59

## 2020-08-30 ENCOUNTER — Encounter (HOSPITAL_COMMUNITY): Payer: Self-pay

## 2020-08-30 ENCOUNTER — Emergency Department (HOSPITAL_COMMUNITY)
Admission: EM | Admit: 2020-08-30 | Discharge: 2020-08-30 | Disposition: A | Discharge status: Home / Self Care | Payer: 59 | Attending: Emergency Medicine | Admitting: Emergency Medicine

## 2020-08-30 ENCOUNTER — Other Ambulatory Visit: Payer: Self-pay

## 2020-08-30 DIAGNOSIS — M545 Low back pain, unspecified: Secondary | ICD-10-CM | POA: Diagnosis present

## 2020-08-30 DIAGNOSIS — Z87891 Personal history of nicotine dependence: Secondary | ICD-10-CM | POA: Insufficient documentation

## 2020-08-30 MED ORDER — IBUPROFEN 800 MG PO TABS: 800.0000 mg | ORAL_TABLET | Freq: Three times a day (TID) | ORAL | 0 refills | Status: AC | PRN

## 2020-08-30 MED ORDER — DICLOFENAC SODIUM 1 % EX GEL: 2.0000 g | Freq: Four times a day (QID) | CUTANEOUS | 0 refills | Status: AC | PRN

## 2020-08-30 MED ORDER — METHOCARBAMOL 500 MG PO TABS
1000.0000 mg | ORAL_TABLET | Freq: Once | ORAL | Status: AC
  Administered 2020-08-30: 1000 mg via ORAL
  Filled 2020-08-30: qty 2

## 2020-08-30 MED ORDER — IBUPROFEN 800 MG PO TABS
800.0000 mg | ORAL_TABLET | Freq: Once | ORAL | Status: AC
  Administered 2020-08-30: 800 mg via ORAL
  Filled 2020-08-30: qty 1

## 2020-08-30 MED ORDER — METHOCARBAMOL 500 MG PO TABS: 500.0000 mg | ORAL_TABLET | Freq: Four times a day (QID) | ORAL | 0 refills | Status: AC | PRN

## 2020-08-30 NOTE — ED Triage Notes (Signed)
Pt presents to ED via [facility] for lower back pain started this morning after pulling drain plug out of tub. Pt denies numbness or tingling.

## 2020-08-30 NOTE — ED Provider Notes (Signed)
Emergency Department Provider Note   I have reviewed the triage vital signs and the nursing notes.   HISTORY  Chief Complaint Back Pain   HPI James Cuevas is a 40 y.o. male with past medical history reviewed below presents to the emergency department with acute onset lower back pain which began this morning.  Patient states that he bent down to pull the plug of the bathtub when he felt sudden tightness in his lower back.  The pain is mainly midline but has a bandlike distribution across the lower back.  No radiation down the legs.  No numbness or weakness in the legs or groin area.  No bowel or bladder incontinence/retention symptoms.  No fevers.  Patient was seen in the emergency department earlier this month with a fall but denies back pain at that time.    Past Medical History:  Diagnosis Date  . ADHD   . Bipolar 1 disorder (HCC)   . Hypercholesteremia     There are no problems to display for this patient.   History reviewed. No pertinent surgical history.  Allergies Depakote [divalproex sodium] and Oxycodone  Family History  Problem Relation Age of Onset  . Cancer Mother   . CAD Father     Social History Social History   Tobacco Use  . Smoking status: Former Games developer  . Smokeless tobacco: Former Clinical biochemist  . Vaping Use: Every day  Substance Use Topics  . Alcohol use: Yes    Comment: occassionally   . Drug use: Never    Review of Systems  Constitutional: No fever/chills Eyes: No visual changes. ENT: No sore throat. Cardiovascular: Denies chest pain. Respiratory: Denies shortness of breath. Gastrointestinal: No abdominal pain.  No nausea, no vomiting.  No diarrhea.  No constipation. Genitourinary: Negative for dysuria. Musculoskeletal: Positive for back pain. Skin: Negative for rash. Neurological: Negative for headaches, focal weakness or numbness.  10-point ROS otherwise  negative.  ____________________________________________   PHYSICAL EXAM:  VITAL SIGNS: ED Triage Vitals  Enc Vitals Group     BP 08/30/20 0733 128/87     Pulse Rate 08/30/20 0733 88     Resp 08/30/20 0733 18     Temp 08/30/20 0733 97.9 F (36.6 C)     Temp Source 08/30/20 0733 Oral     SpO2 08/30/20 0733 99 %     Weight 08/30/20 0732 223 lb (101.2 kg)     Height 08/30/20 0732 6' (1.829 m)   Constitutional: Alert and oriented. Laying flat in bed appearing uncomfortable with any movement.  Eyes: Conjunctivae are normal.  Head: Atraumatic. Nose: No congestion/rhinnorhea. Mouth/Throat: Mucous membranes are moist.  Neck: No stridor.   Cardiovascular: Normal rate, regular rhythm. Good peripheral circulation. Grossly normal heart sounds.   Respiratory: Normal respiratory effort.  No retractions. Lungs CTAB. Gastrointestinal: Soft and nontender. No distention.  Musculoskeletal: No lower extremity tenderness nor edema. No gross deformities of extremities.  Patient with midline tenderness diffusely throughout the lumbar spine with some associated paraspinal tenderness .  Neurologic:  Normal speech and language. No gross focal neurologic deficits are appreciated.  Patient is moving the upper and lower extremities equally.  No numbness.  Skin:  Skin is warm, dry and intact. No rash noted.  ____________________________________________  RADIOLOGY  DG Lumbar Spine Complete  Result Date: 08/30/2020 CLINICAL DATA:  Back pain after bending over. EXAM: LUMBAR SPINE - COMPLETE 4+ VIEW COMPARISON:  None. FINDINGS: There is no evidence of lumbar spine fracture. Mild  retrolisthesis at L5-S1, accentuated by obliquity. Intervertebral disc spaces are maintained. Notable atheromatous calcification seen at the aorta. IMPRESSION: Negative for fracture or significant degenerative change. Electronically Signed   By: Marnee Spring M.D.   On: 08/30/2020 08:25     ____________________________________________   PROCEDURES  Procedure(s) performed:   Procedures  None  ____________________________________________   INITIAL IMPRESSION / ASSESSMENT AND PLAN / ED COURSE  Pertinent labs & imaging results that were available during my care of the patient were reviewed by me and considered in my medical decision making (see chart for details).   Patient presents to the emergency department with acute onset back pain after bending.  Differential includes myofascial strain vs disc herniation.  Patient has no red flag signs or symptoms to suspect cauda equina, epidural abscess, subdural space hemorrhage.  Low risk for these etiologies.  Patient did have a fall earlier this week and does have some midline tenderness on exam.  Plan for screening lumbar spine x-ray.  Patient requesting no medication administered via needle due to phobia so if ordered pain medication here.  His wife can drive him home.   Plain films reviewed. No acute findings. Plan for symptom mgmt at home and outpatient PCP and spine follow up. Discussed ED return precautions and provided in writing as well.  ____________________________________________  FINAL CLINICAL IMPRESSION(S) / ED DIAGNOSES  Final diagnoses:  Acute midline low back pain without sciatica     MEDICATIONS GIVEN DURING THIS VISIT:  Medications  methocarbamol (ROBAXIN) tablet 1,000 mg (1,000 mg Oral Given 08/30/20 0834)  ibuprofen (ADVIL) tablet 800 mg (800 mg Oral Given 08/30/20 0834)     NEW OUTPATIENT MEDICATIONS STARTED DURING THIS VISIT:  Discharge Medication List as of 08/30/2020  8:46 AM    START taking these medications   Details  diclofenac Sodium (VOLTAREN) 1 % GEL Apply 2 g topically 4 (four) times daily as needed., Starting Mon 08/30/2020, Normal    methocarbamol (ROBAXIN) 500 MG tablet Take 1 tablet (500 mg total) by mouth every 6 (six) hours as needed for muscle spasms., Starting Mon 08/30/2020,  Normal        Note:  This document was prepared using Dragon voice recognition software and may include unintentional dictation errors.  Alona Bene, MD, [facility]    Liza Czerwinski, Arlyss Repress, MD 08/30/20 [phone]

## 2020-08-30 NOTE — Discharge Instructions (Signed)
You have been seen in the Emergency Department (ED)  today for back pain.  Your workup and exam have not shown any acute abnormalities and you are likely suffering from muscle strain or possible problems with your discs, but there is no treatment that will fix your symptoms at this time.  Please take Motrin (ibuprofen) as needed for your pain according to the instructions written on the box.    Please follow up with your doctor as soon as possible regarding today's ED visit and your back pain.  Return to the ED for worsening back pain, fever, weakness or numbness of either leg, or if you develop either (1) an inability to urinate or have bowel movements, or (2) loss of your ability to control your bathroom functions (if you start having "accidents"), or if you develop other new symptoms that concern you.  

## 2022-08-02 IMAGING — DX DG LUMBAR SPINE COMPLETE 4+V
5 series · 5 of 5 positions shown · non-contrast
Comparison: None.

CLINICAL DATA: Back pain after bending over.

EXAM:
LUMBAR SPINE - COMPLETE 4+ VIEW

[l-spine ap]
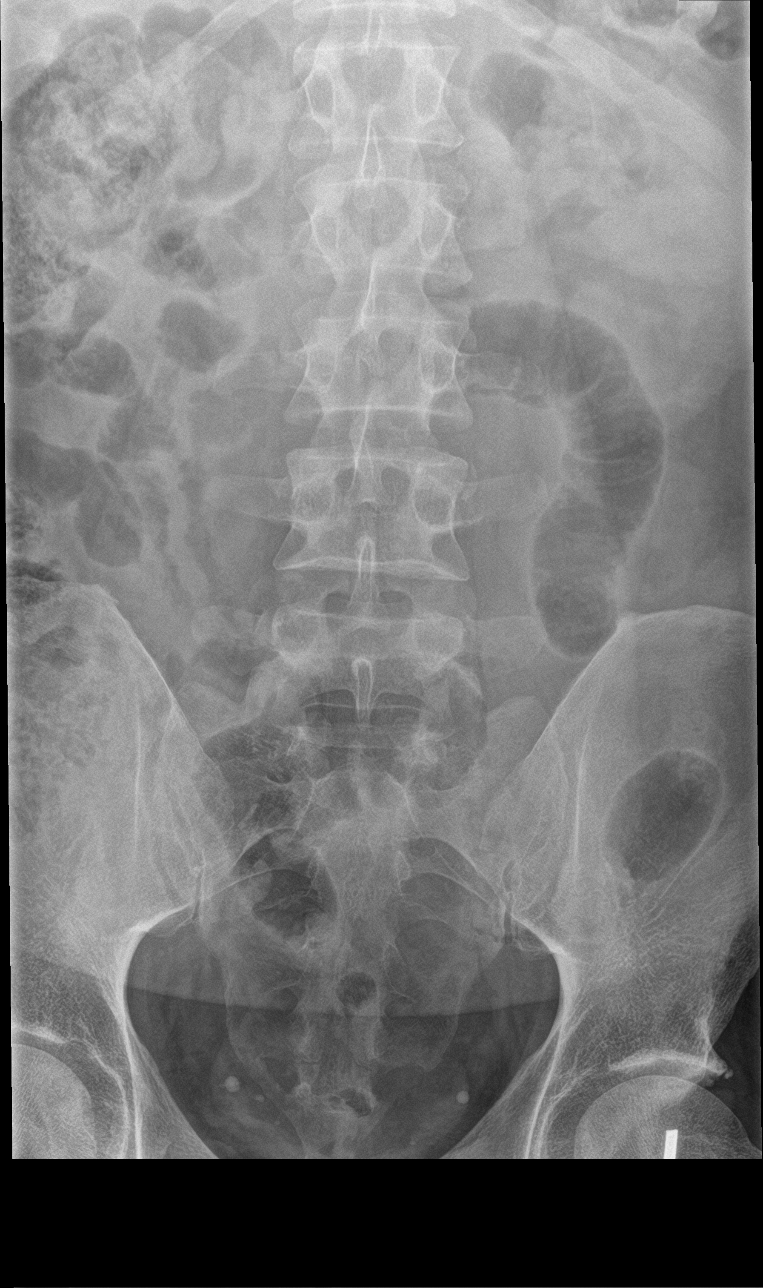

[l-spine obl (1 of 2)]
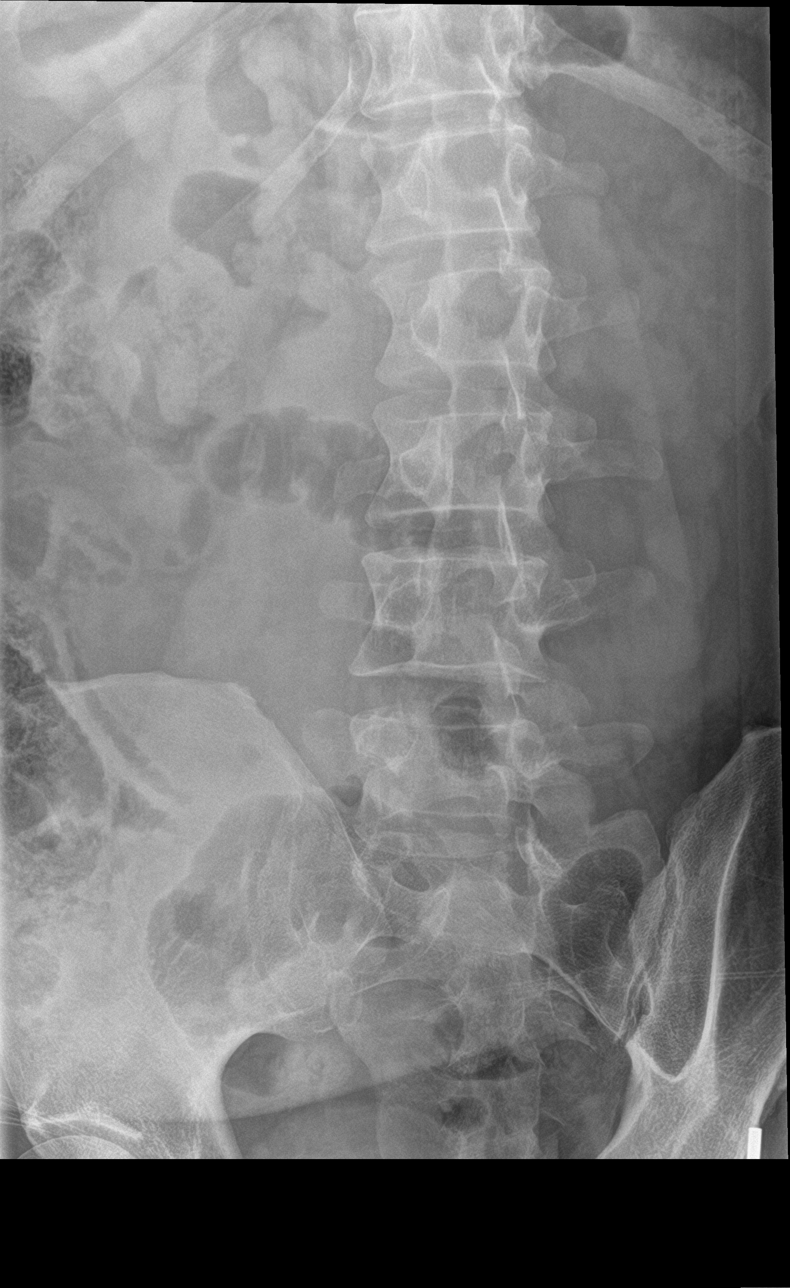

[l-spine obl (2 of 2)]
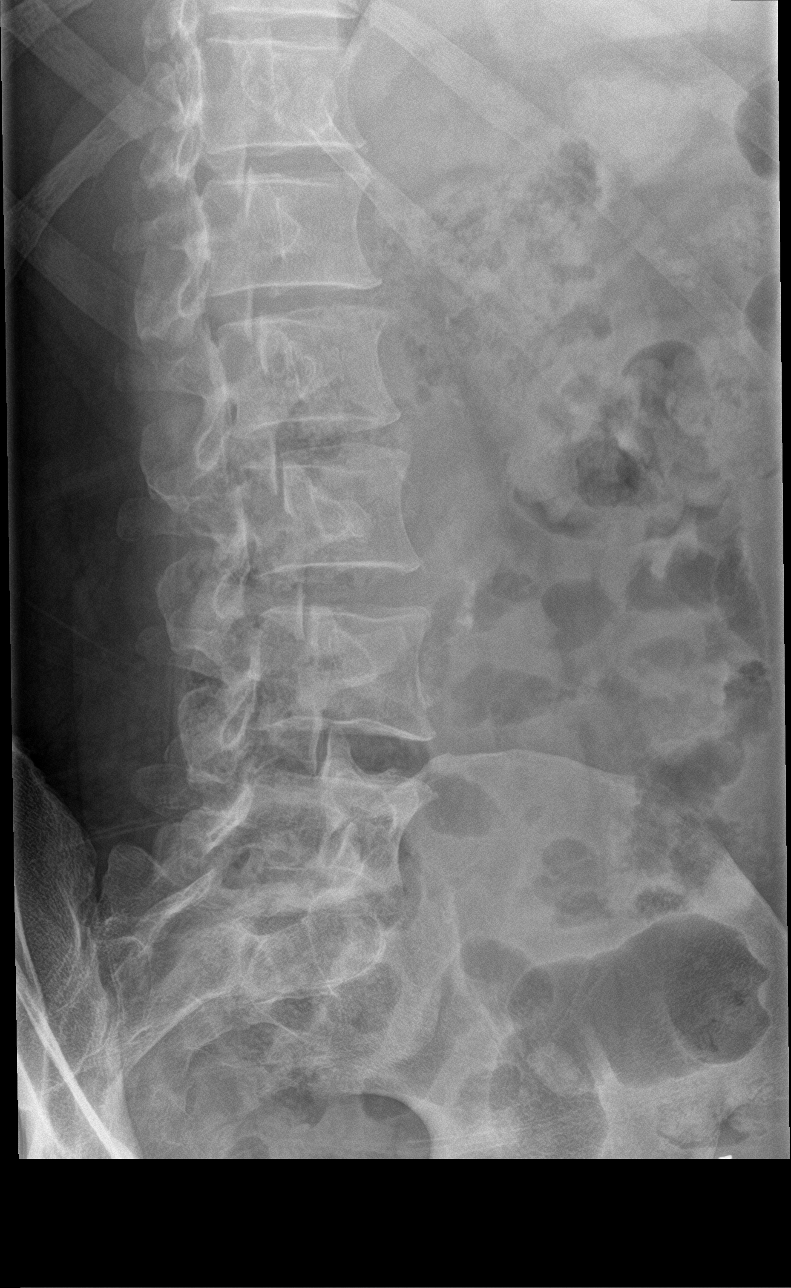

[l-spine lat]
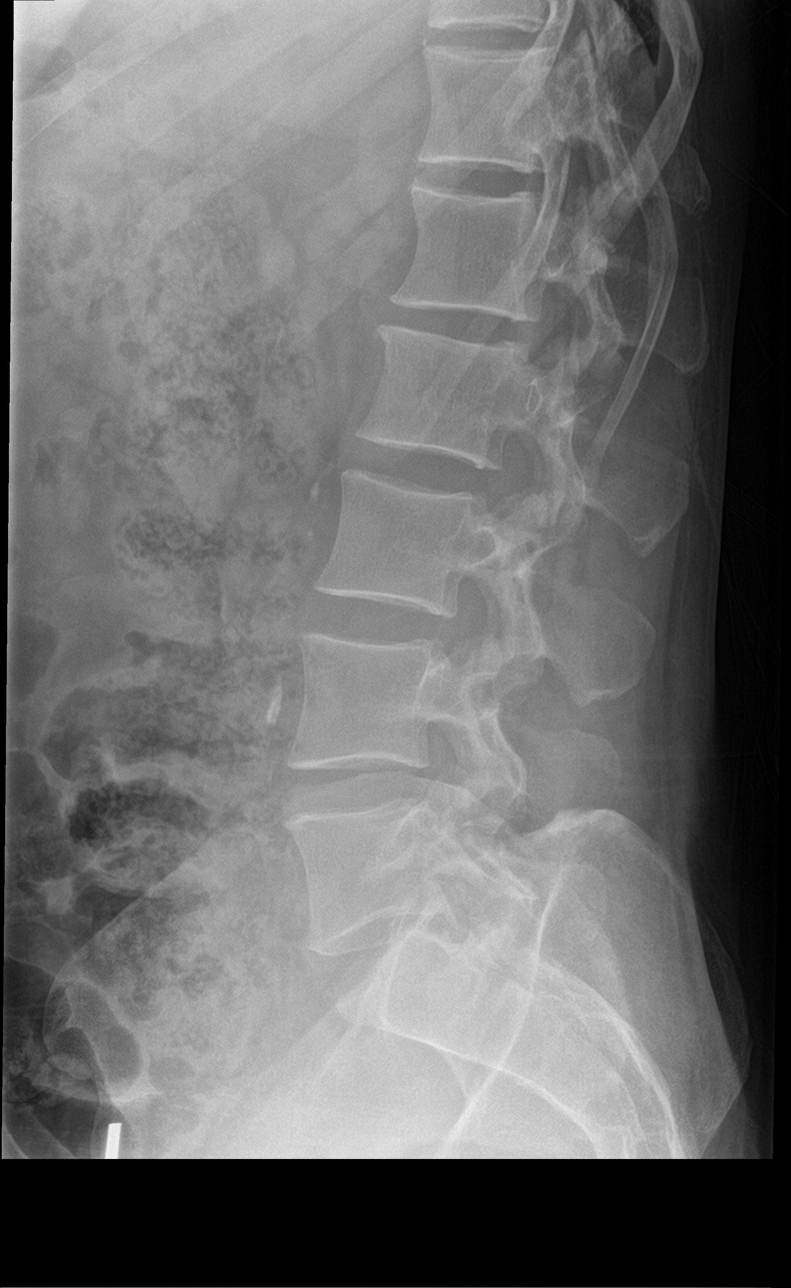

[l-spine spot]
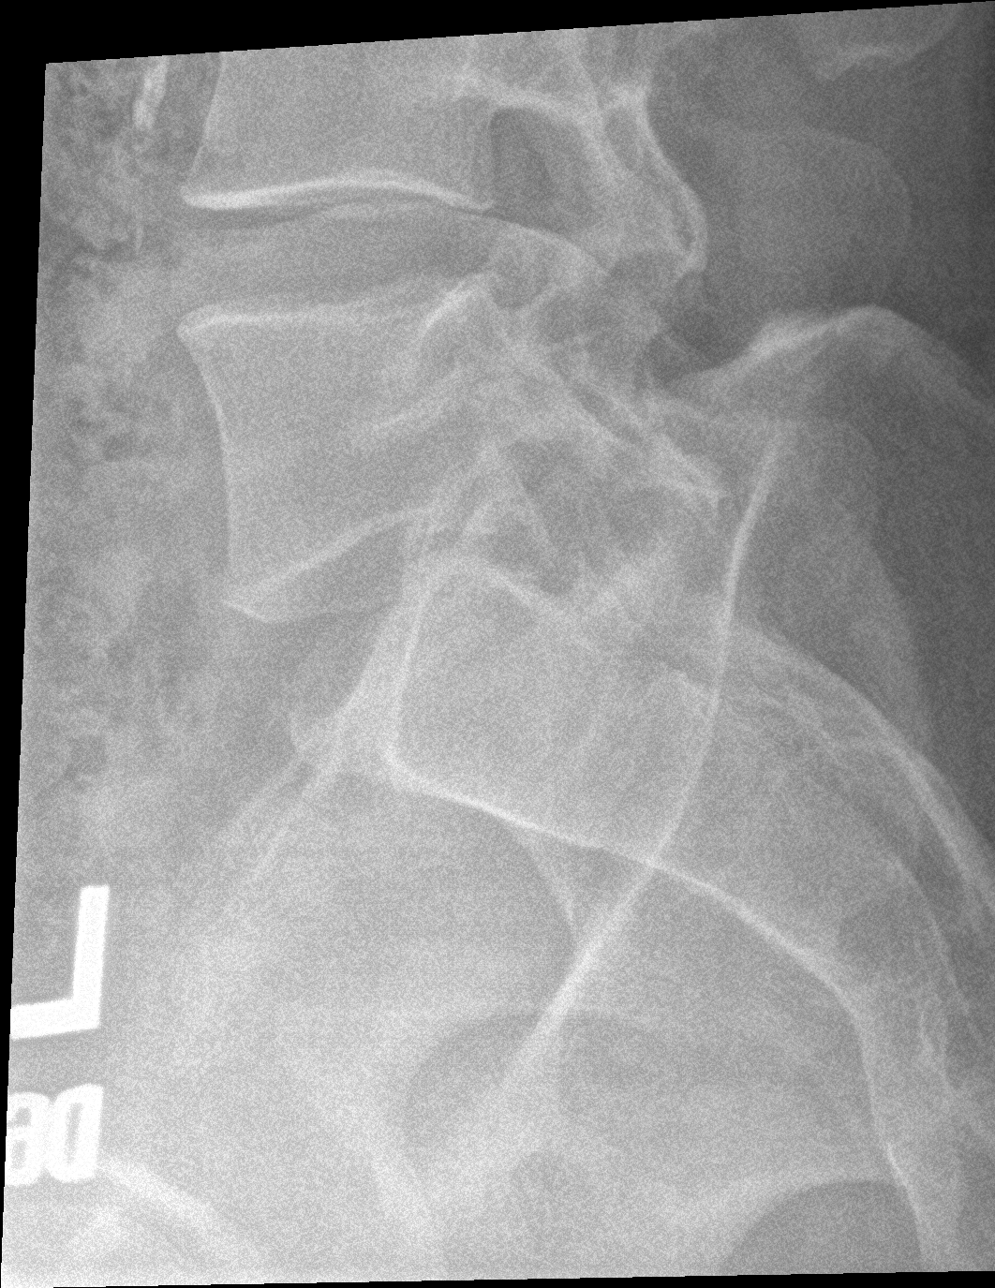

[5 of 5 positions shown; findings below may reference images not displayed]

FINDINGS: There is no evidence of lumbar spine fracture. Mild retrolisthesis
at L5-S1, accentuated by obliquity. Intervertebral disc spaces are
maintained.

Notable atheromatous calcification seen at the aorta.
IMPRESSION: Negative for fracture or significant degenerative change.
